# Patient Record
Sex: Female | Born: 2000 | Race: Black or African American | Hispanic: No | Marital: Single | Smoking: Never smoker
Health system: Southern US, Community
[De-identification: ages and names within clinical notes are randomized; demographics above are authoritative.]

---
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
## 2020-11-15 IMAGING — DX ABDOMEN - 2 VIEW
2 series · 2 of 2 positions shown · non-contrast
Comparison: None.

CLINICAL DATA: Abdominal pain

EXAM:
ABDOMEN - 2 VIEW

[abdomen erect]
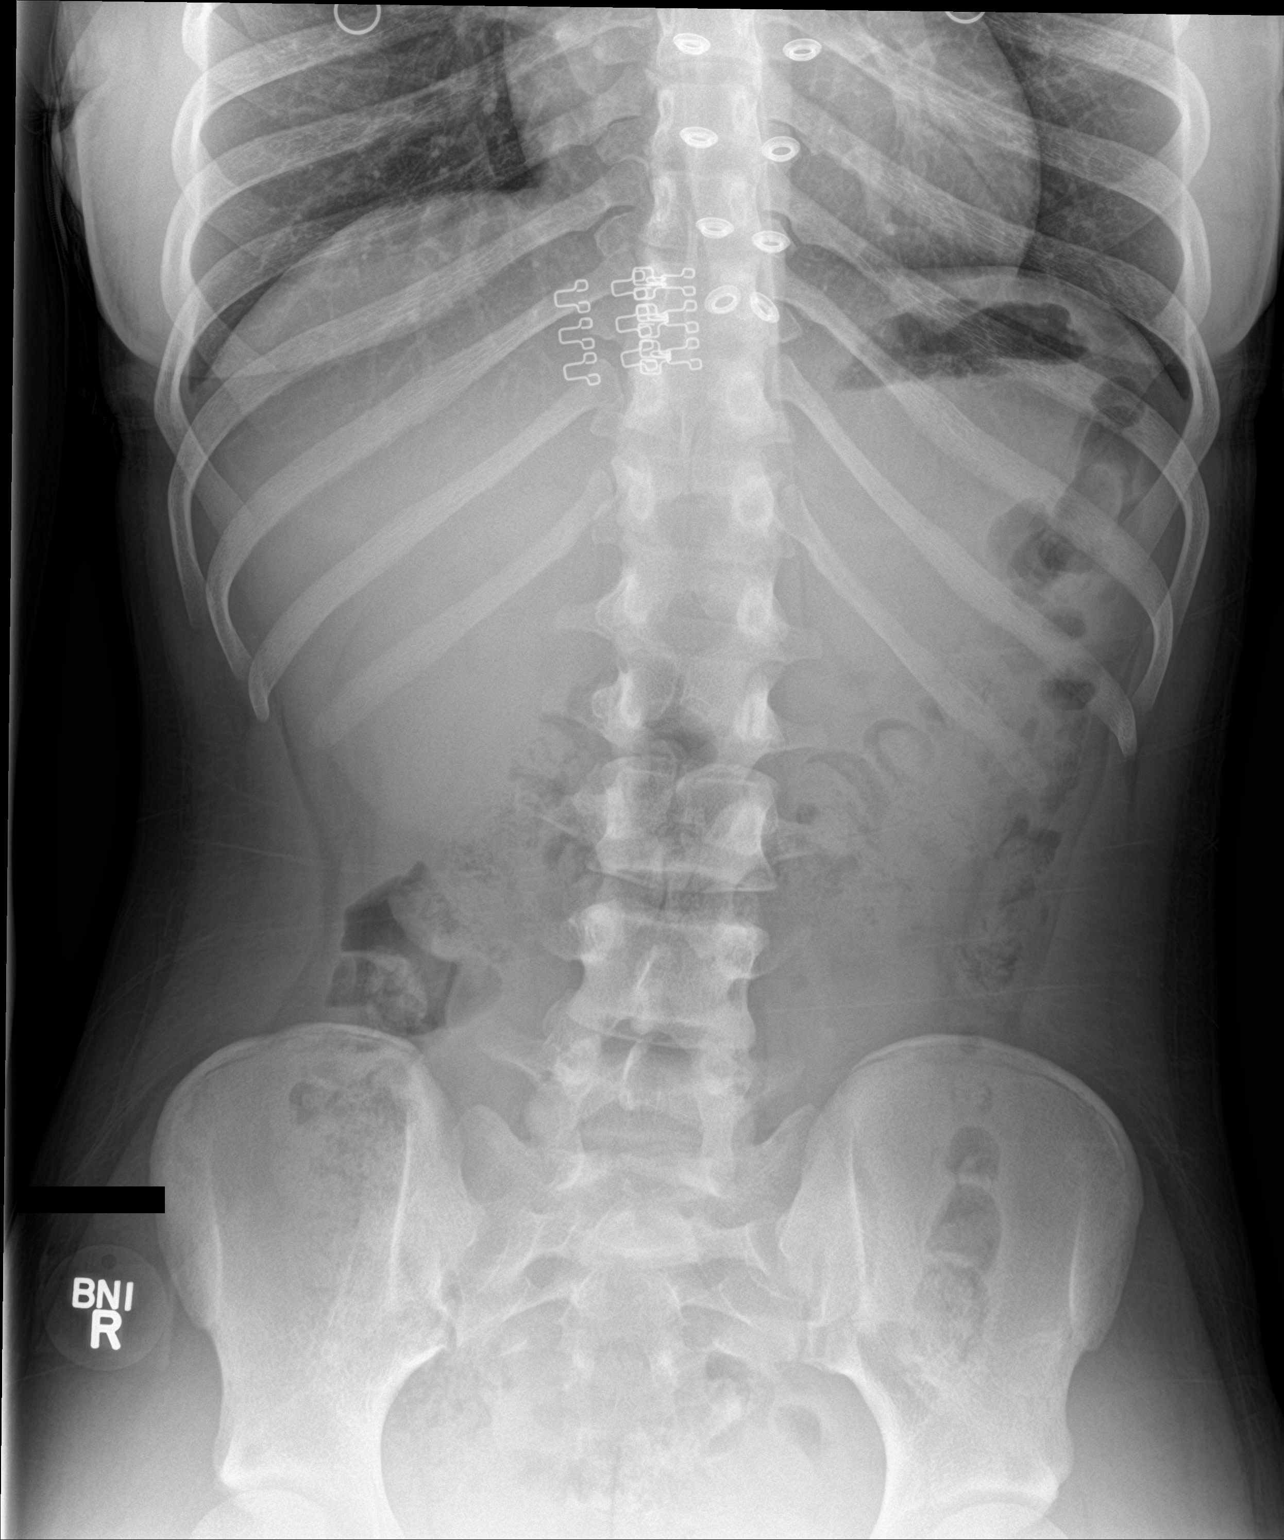

[abdomen supine]
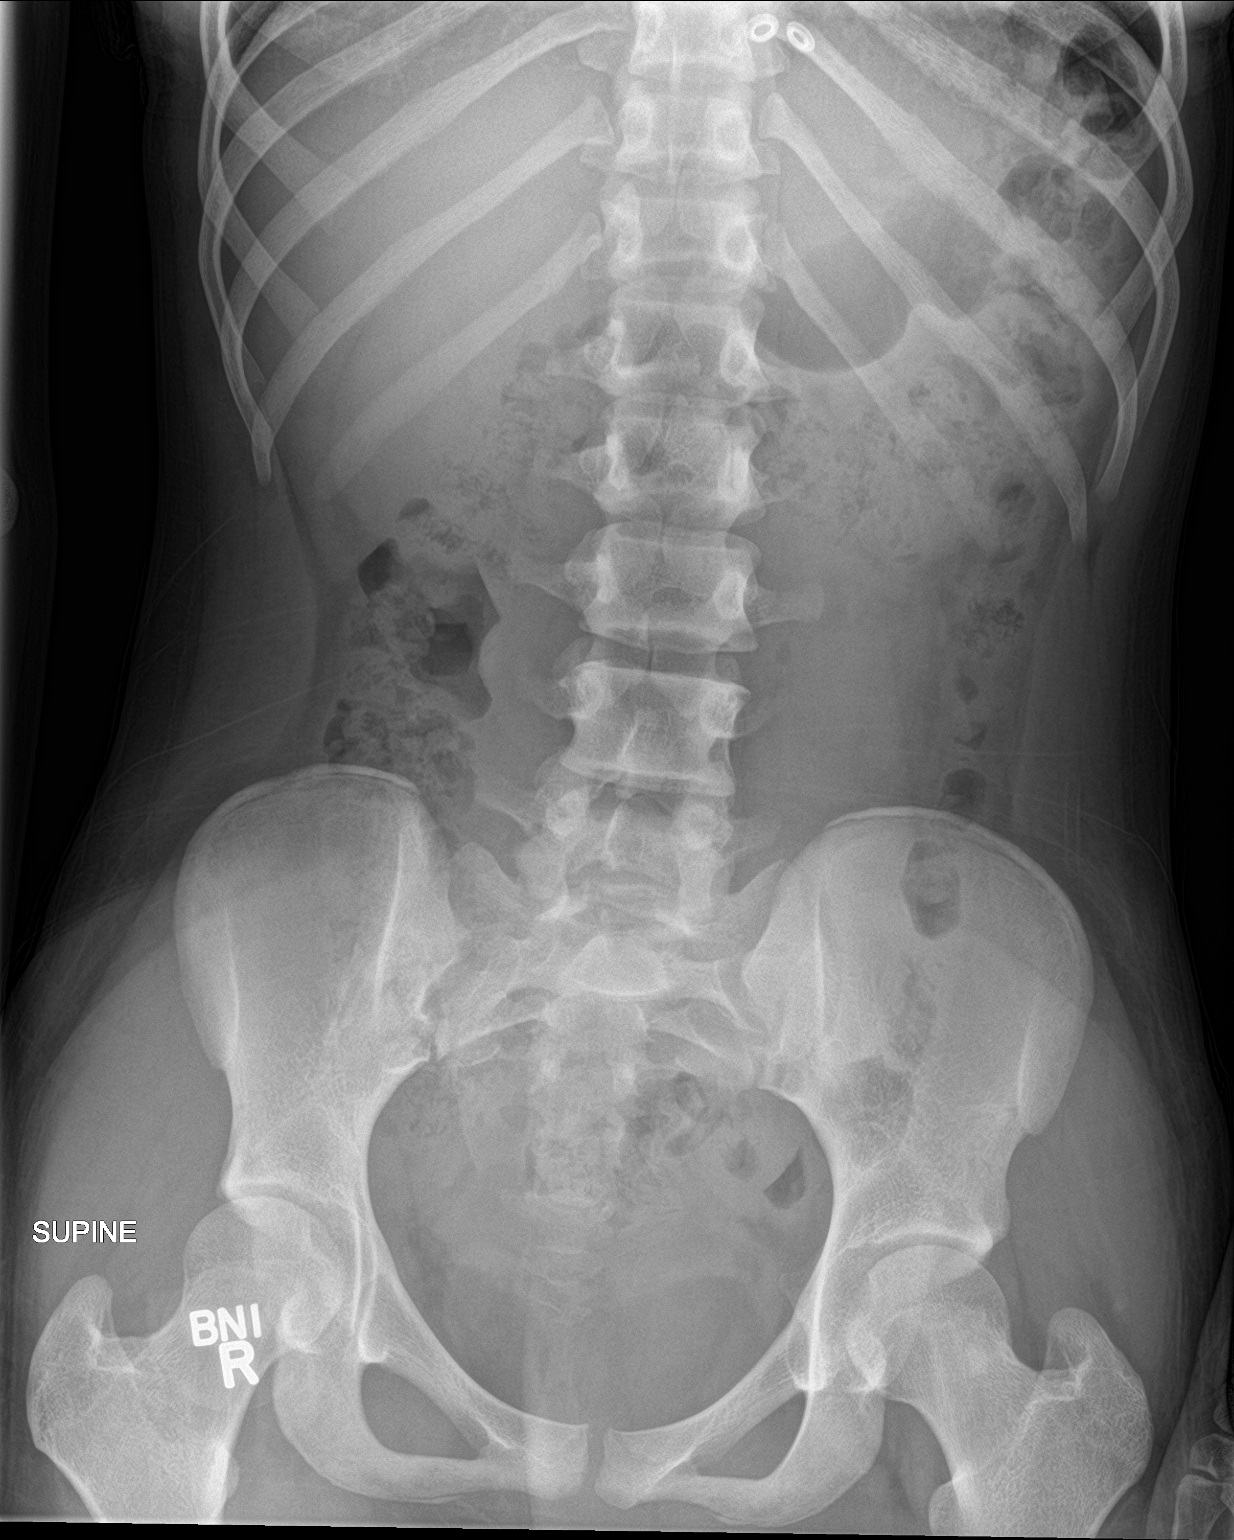

[2 of 2 positions shown; findings below may reference images not displayed]

FINDINGS: The bowel gas pattern is normal. Moderate to large amount of stool
in the colon. There is no evidence of free air. No radio-opaque
calculi or other significant radiographic abnormality is seen.
IMPRESSION: Negative.

## 2021-01-26 IMAGING — CT CT HEAD WITHOUT CONTRAST
4 series · 17 of 47 positions shown, 19 images · non-contrast
Comparison: None.

CLINICAL DATA: Patient had 2 seizures at home. History of seizure
in 9036. Headache. Patient did fall and hit head.

EXAM:
CT HEAD WITHOUT CONTRAST
TECHNIQUE: Contiguous axial images were obtained from the base of the skull
through the vertex without intravenous contrast.

[Series 3: head wo · axial · 0.48mm/px · z∈[-92,+23]mm · 7 of 31 slices shown, 9 images]
[im 4/31  brain]
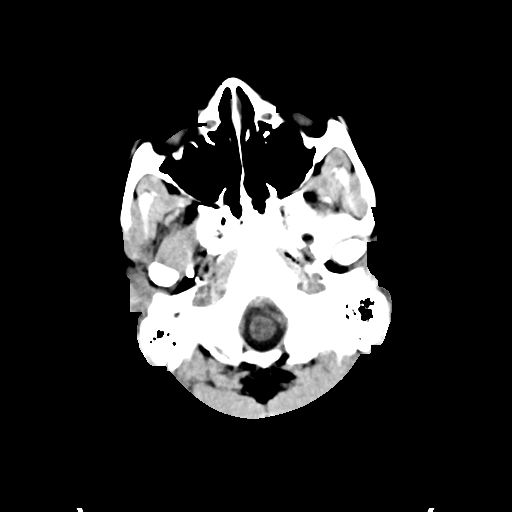
[im 4/31  bone]
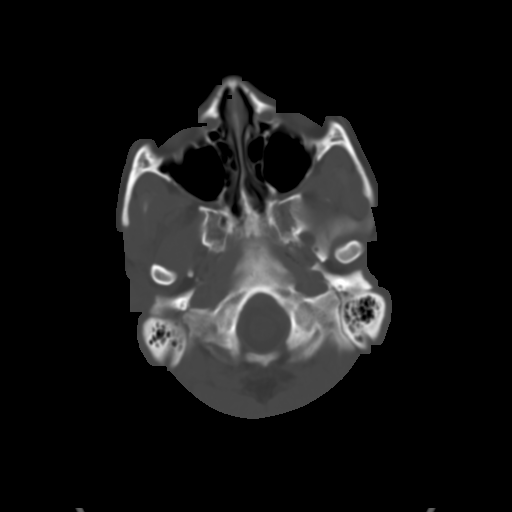
[im 8/31  brain]
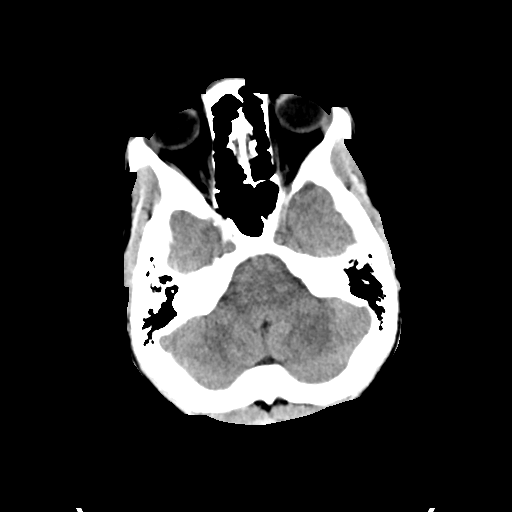
[im 12/31  brain]
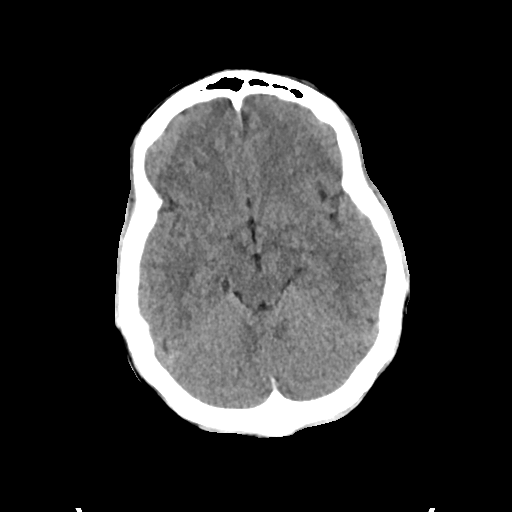
[im 16/31  brain]
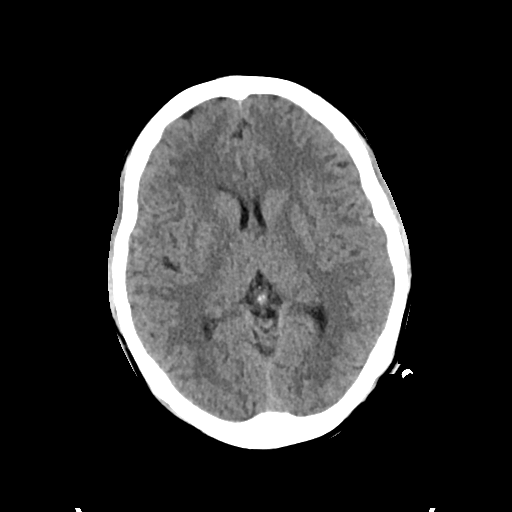
[im 19/31  brain]
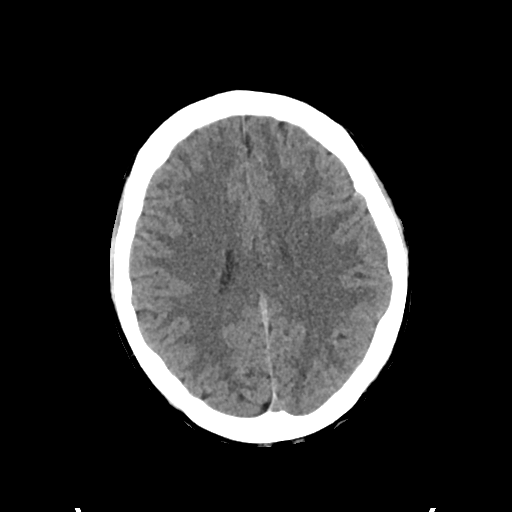
[im 19/31  bone]
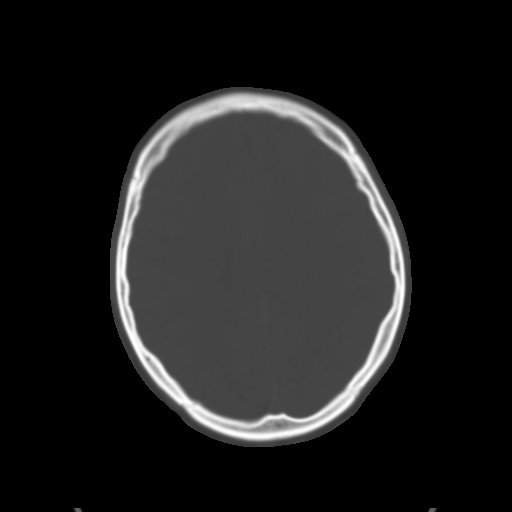
[im 23/31  brain]
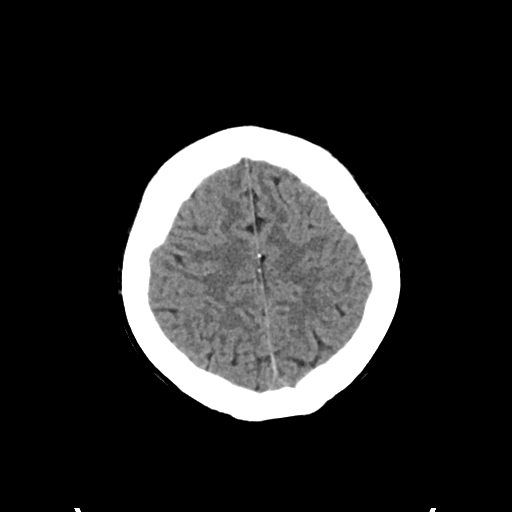
[im 27/31  brain]
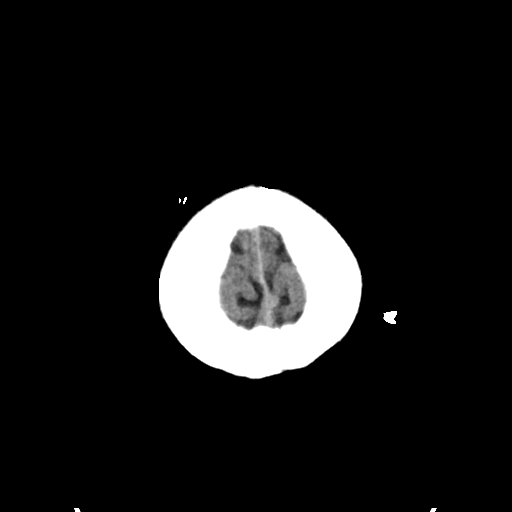

[Series 4: head bone · axial · 0.48mm/px · z∈[-93,-39]mm · 4 of 77 slices shown]
[im 8/77  bone]
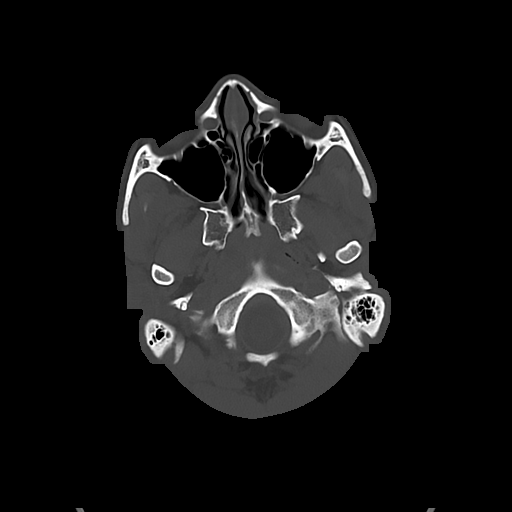
[im 16/77  bone]
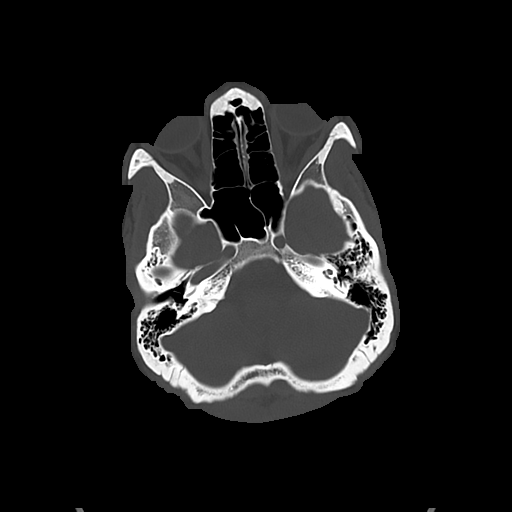
[im 23/77  bone]
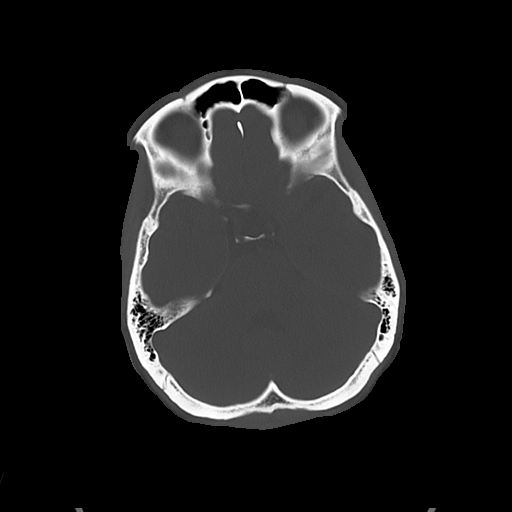
[im 35/77  bone]
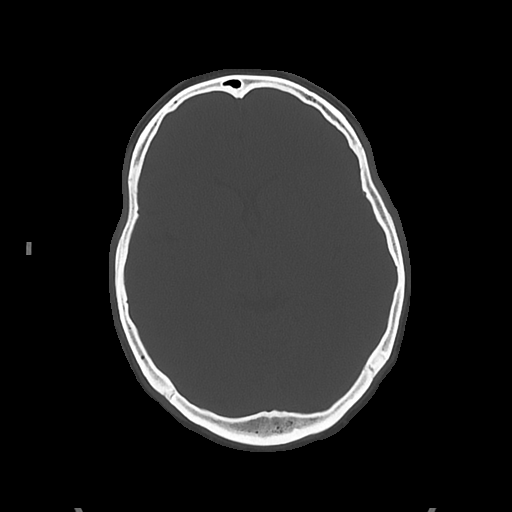

[Series 5: cor soft · coronal · 0.31mm/px · 3 of 68 slices shown]
[im 23/68  brain]
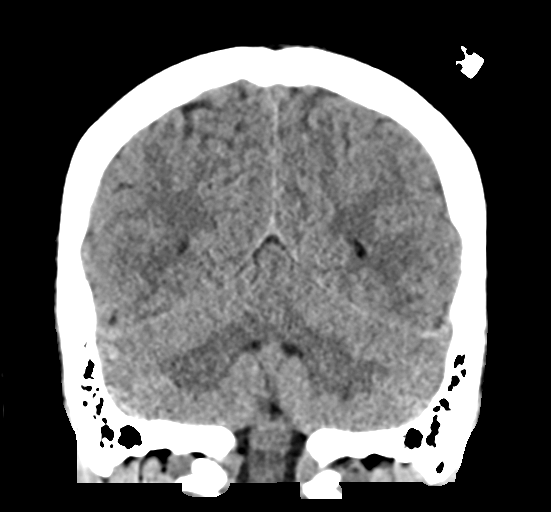
[im 30/68  brain]
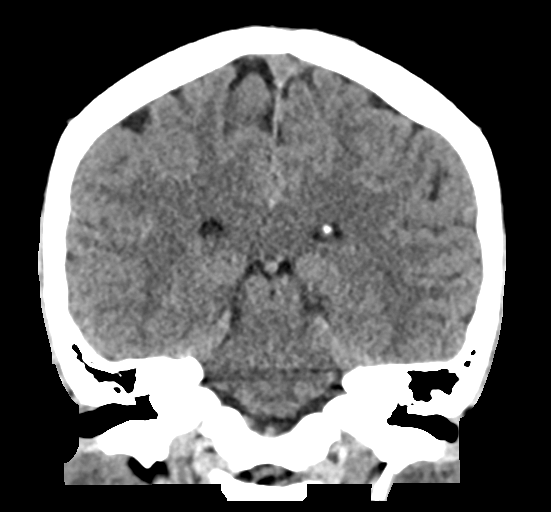
[im 38/68  brain]
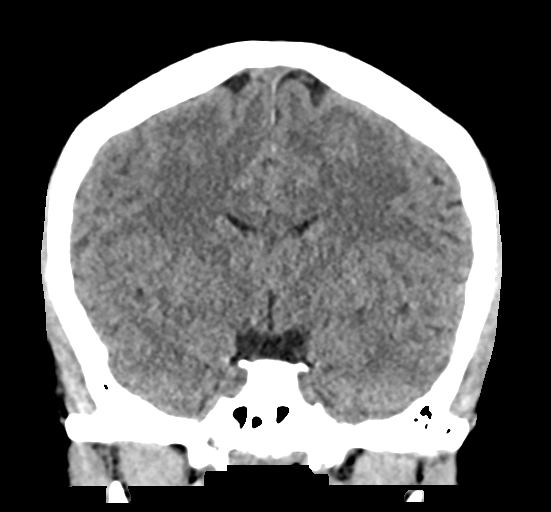

[Series 6: sag soft · sagittal · 0.31mm/px · 3 of 57 slices shown]
[im 19/57  brain]
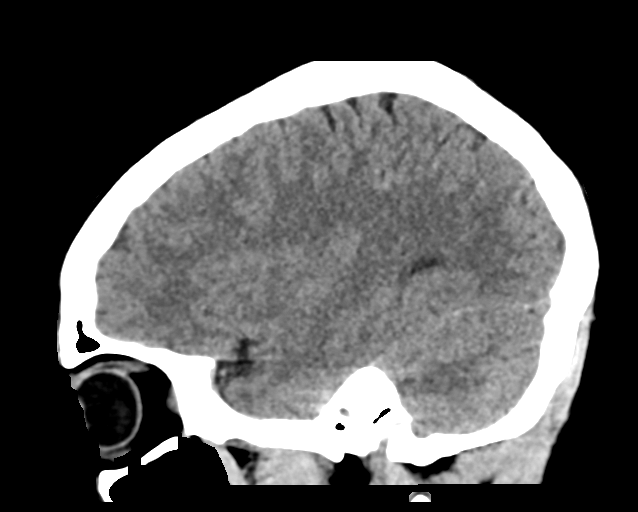
[im 29/57  brain]
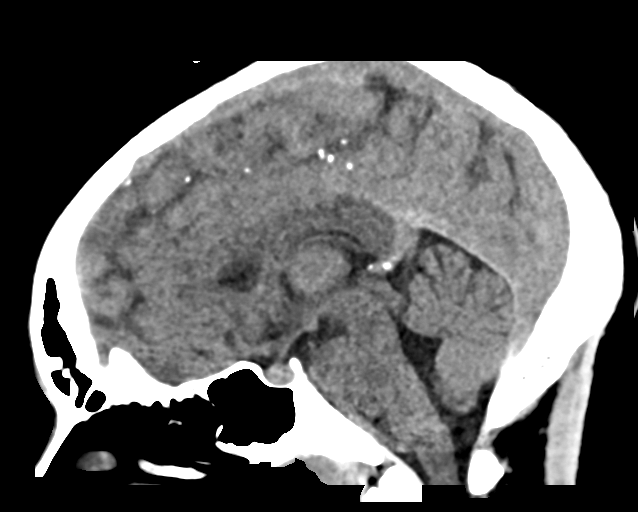
[im 38/57  brain]
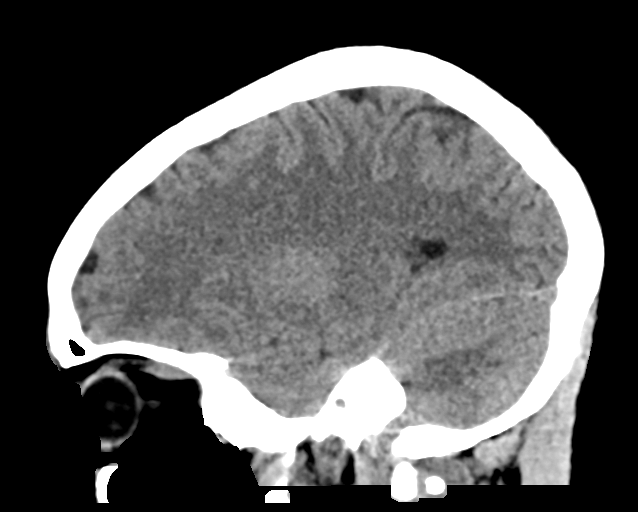

[17 of 47 positions shown; findings below may reference images not displayed]

FINDINGS: Brain: No evidence of acute infarction, hemorrhage, hydrocephalus,
extra-axial collection or mass lesion/mass effect.

Vascular: No hyperdense vessel or unexpected calcification.

Skull: Normal. Negative for fracture or focal lesion.

Sinuses/Orbits: No acute finding.

Other: There is a skin staple over the posterior scalp. Extracranial
soft tissues are otherwise normal.
IMPRESSION: Skin staple over the posterior scalp. No acute intracranial
abnormalities.
